# Patient Record
Sex: Male | Born: 1975 | Race: White | Hispanic: Yes | Marital: Single | Smoking: Former smoker
Health system: Southern US, Community
[De-identification: ages and names within clinical notes are randomized; demographics above are authoritative.]

## PROBLEM LIST (undated history)

## (undated) HISTORY — PX: KNEE SURGERY: SHX244

---

## 2004-08-12 ENCOUNTER — Emergency Department: Payer: Self-pay | Admitting: Emergency Medicine

## 2004-08-17 ENCOUNTER — Emergency Department: Payer: Self-pay | Admitting: Emergency Medicine

## 2007-10-23 ENCOUNTER — Emergency Department (HOSPITAL_COMMUNITY): Admission: EM | Admit: 2007-10-23 | Discharge: 2007-10-23 | Payer: Self-pay | Admitting: Family Medicine

## 2016-08-19 ENCOUNTER — Encounter: Payer: Self-pay | Admitting: Emergency Medicine

## 2016-08-19 ENCOUNTER — Emergency Department
Admission: EM | Admit: 2016-08-19 | Discharge: 2016-08-19 | Disposition: A | Discharge status: Home / Self Care | Payer: Self-pay | Attending: Emergency Medicine | Admitting: Emergency Medicine

## 2016-08-19 ENCOUNTER — Emergency Department: Payer: Self-pay

## 2016-08-19 DIAGNOSIS — J189 Pneumonia, unspecified organism: Secondary | ICD-10-CM | POA: Insufficient documentation

## 2016-08-19 MED ORDER — IBUPROFEN 600 MG PO TABS
ORAL_TABLET | ORAL | Status: AC
  Administered 2016-08-19: 600 mg via ORAL
  Filled 2016-08-19: qty 1

## 2016-08-19 MED ORDER — DOXYCYCLINE HYCLATE 100 MG PO CAPS: 100.0000 mg | ORAL_CAPSULE | Freq: Two times a day (BID) | ORAL | 0 refills | Status: DC

## 2016-08-19 MED ORDER — IBUPROFEN 600 MG PO TABS
600.0000 mg | ORAL_TABLET | Freq: Once | ORAL | Status: AC
  Administered 2016-08-19: 600 mg via ORAL

## 2016-08-19 MED ORDER — IPRATROPIUM-ALBUTEROL 0.5-2.5 (3) MG/3ML IN SOLN
3.0000 mL | Freq: Once | RESPIRATORY_TRACT | Status: AC
  Administered 2016-08-19: 3 mL via RESPIRATORY_TRACT
  Filled 2016-08-19: qty 3

## 2016-08-19 NOTE — Discharge Instructions (Signed)
Please seek medical attention for any high fevers, chest pain, shortness of breath, change in behavior, persistent vomiting, bloody stool or any other new or concerning symptoms.  

## 2016-08-19 NOTE — ED Notes (Signed)
Pt transferred to Xray by [employer]

## 2016-08-19 NOTE — ED Provider Notes (Signed)
[facility] Emergency Department Provider Note  ____________________________________________   I have reviewed the triage vital signs and the nursing notes.   HISTORY  Chief Complaint Nasal Congestion   History limited by: Language [facility] Interpreter utilized   HPI Darrell Meyer is a 41 y.o. male who presents to the emergency department today because of concern for fever, cough, nasal congestion and headache. The symptoms have been present for the past 10-11 days. He does not have a thermometer so is unsure how high his fever has gotten. His fever has not been present every day. He had been using ibuprofen for the temperature and allergy medication for nasal congestion. Denies sick contacts.    History reviewed. No pertinent past medical history.  There are no active problems to display for this patient.   History reviewed. No pertinent surgical history.  Prior to Admission medications   Not on File    Allergies Patient has no known allergies.  History reviewed. No pertinent family history.  Social History Social History  Substance Use Topics  . Smoking status: Never Smoker  . Smokeless tobacco: Never Used  . Alcohol use Yes    Review of Systems  Constitutional: Positive for fever. Cardiovascular: Positive for chest pain. Respiratory: Positive for cough. Gastrointestinal: Negative for abdominal pain, vomiting and diarrhea. Genitourinary: Negative for dysuria. Musculoskeletal: Negative for back pain. Skin: Negative for rash. Neurological: Positive for headache.  10-point ROS otherwise negative.  ____________________________________________   PHYSICAL EXAM:  VITAL SIGNS: ED Triage Vitals [08/19/16 0508]  Enc Vitals Group     BP (!) 149/89     Pulse Rate 88     Resp 18     Temp 98.5 F (36.9 C)     Temp Source Oral     SpO2 100 %     Weight 207 lb (93.9 kg)     Height 6' (1.829 m)     Head Circumference    Peak Flow      Pain Score 9   Constitutional: Alert and oriented. Well appearing and in no distress. Eyes: Conjunctivae are normal. Normal extraocular movements. ENT   Head: Normocephalic and atraumatic.   Nose: No congestion/rhinnorhea.   Mouth/Throat: Mucous membranes are moist.   Neck: No stridor. Hematological/Lymphatic/Immunilogical: No cervical lymphadenopathy. Cardiovascular: Normal rate, regular rhythm.  No murmurs, rubs, or gallops.  Respiratory: Normal respiratory effort without tachypnea nor retractions. Rhonchi in right lower lung. Gastrointestinal: Soft and non tender. No rebound. No guarding.  Genitourinary: Deferred Musculoskeletal: Normal range of motion in all extremities. No lower extremity edema. Neurologic:  Normal speech and language. No gross focal neurologic deficits are appreciated.  Skin:  Skin is warm, dry and intact. No rash noted. Psychiatric: Mood and affect are normal. Speech and behavior are normal. Patient exhibits appropriate insight and judgment.  ____________________________________________    LABS (pertinent positives/negatives)  None  ____________________________________________   EKG  None  ____________________________________________    RADIOLOGY  CXR IMPRESSION:  1. Subtle streaky opacities at the left lung base, most likely  atelectasis, less likely pneumonia unless febrile.  2. Remainder of the chest x-ray is unremarkable.    ___________________________________________   PROCEDURES  Procedures  ____________________________________________   INITIAL IMPRESSION / ASSESSMENT AND PLAN / ED COURSE  Pertinent labs & imaging results that were available during my care of the patient were reviewed by me and considered in my medical decision making (see chart for details).  Patient presented to the emergency today with  multiple complaints. Chest x-ray is concerning for possible pneumonia. Patient states that he  has been having fevers and cough. Given this finding do think patient would benefit from a course of antibiotics. Additionally was complaining of some headache and sinus pain. Will plan on placing patient on doxycycline for pneumonia and possible sinusitis.  ____________________________________________   FINAL CLINICAL IMPRESSION(S) / ED DIAGNOSES  Final diagnoses:  Community acquired pneumonia, unspecified laterality     Note: This dictation was prepared with Office manager. Any transcriptional errors that result from this process are unintentional     Phineas Semen, MD 08/19/16 [phone]

## 2016-08-19 NOTE — ED Triage Notes (Addendum)
Pt ambulatory to triage in NAD, report nasal congestion and body aches x 12 days.  Pt reports taking claritin w/o relief.  Pt afebrile in triage.

## 2017-03-13 ENCOUNTER — Encounter: Payer: Self-pay | Admitting: Emergency Medicine

## 2017-03-13 ENCOUNTER — Other Ambulatory Visit: Payer: Self-pay

## 2017-03-13 DIAGNOSIS — R112 Nausea with vomiting, unspecified: Secondary | ICD-10-CM | POA: Insufficient documentation

## 2017-03-13 DIAGNOSIS — R197 Diarrhea, unspecified: Secondary | ICD-10-CM | POA: Insufficient documentation

## 2017-03-13 DIAGNOSIS — R1011 Right upper quadrant pain: Secondary | ICD-10-CM | POA: Insufficient documentation

## 2017-03-13 LAB — COMPREHENSIVE METABOLIC PANEL
ALBUMIN: 4.4 g/dL (ref 3.5–5.0)
ALK PHOS: 78 U/L (ref 38–126)
ALT: 24 U/L (ref 17–63)
ANION GAP: 10 (ref 5–15)
AST: 18 U/L (ref 15–41)
BUN: 17 mg/dL (ref 6–20)
CALCIUM: 9.1 mg/dL (ref 8.9–10.3)
CHLORIDE: 104 mmol/L (ref 101–111)
CO2: 23 mmol/L (ref 22–32)
CREATININE: 1.3 mg/dL — AB (ref 0.61–1.24)
GFR calc non Af Amer: >60 mL/min (ref >60)
GLUCOSE: 116 mg/dL — AB (ref 65–99)
Potassium: 4.2 mmol/L (ref 3.5–5.1)
SODIUM: 137 mmol/L (ref 135–145)
Total Bilirubin: 0.3 mg/dL (ref 0.3–1.2)
Total Protein: 7.6 g/dL (ref 6.5–8.1)

## 2017-03-13 LAB — CBC
HCT: 49.2 % (ref 40.0–52.0)
HEMOGLOBIN: 16.6 g/dL (ref 13.0–18.0)
MCH: 31.1 pg (ref 26.0–34.0)
MCHC: 33.7 g/dL (ref 32.0–36.0)
MCV: 92.2 fL (ref 80.0–100.0)
Platelets: 166 10*3/uL (ref 150–440)
RBC: 5.33 MIL/uL (ref 4.40–5.90)
RDW: 13.9 % (ref 11.5–14.5)
WBC: 8.6 10*3/uL (ref 3.8–10.6)

## 2017-03-13 LAB — URINALYSIS, COMPLETE (UACMP) WITH MICROSCOPIC
Bilirubin Urine: NEGATIVE
Glucose, UA: NEGATIVE mg/dL
Hgb urine dipstick: NEGATIVE
Ketones, ur: NEGATIVE mg/dL
Leukocytes, UA: NEGATIVE
Nitrite: NEGATIVE
Protein, ur: NEGATIVE mg/dL
SPECIFIC GRAVITY, URINE: 1.02 (ref 1.005–1.030)
pH: 5 (ref 5.0–8.0)

## 2017-03-13 LAB — TROPONIN I

## 2017-03-13 LAB — LIPASE, BLOOD: LIPASE: 30 U/L (ref 11–51)

## 2017-03-13 NOTE — ED Triage Notes (Signed)
Pt presents to ED with diarrhea and vomiting after eating for the past 3 days. Pt reports intermittently feeling full in his abd with pressure and bloating, especially after eating. Pt alert and calm during triage. No increased work of breathing or distress noted at this time.

## 2017-03-13 NOTE — ED Triage Notes (Signed)
Patient to ER for c/o generalized abd pain x3 days that began with diarrhea.

## 2017-03-14 ENCOUNTER — Emergency Department
Admission: EM | Admit: 2017-03-14 | Discharge: 2017-03-14 | Disposition: A | Discharge status: Home / Self Care | Payer: Self-pay | Attending: Emergency Medicine | Admitting: Emergency Medicine

## 2017-03-14 ENCOUNTER — Emergency Department: Payer: Self-pay

## 2017-03-14 DIAGNOSIS — R1011 Right upper quadrant pain: Secondary | ICD-10-CM

## 2017-03-14 DIAGNOSIS — R112 Nausea with vomiting, unspecified: Secondary | ICD-10-CM

## 2017-03-14 DIAGNOSIS — A09 Infectious gastroenteritis and colitis, unspecified: Secondary | ICD-10-CM

## 2017-03-14 MED ORDER — KETOROLAC TROMETHAMINE 30 MG/ML IJ SOLN
30.0000 mg | Freq: Once | INTRAMUSCULAR | Status: AC
  Administered 2017-03-14: 30 mg via INTRAVENOUS
  Filled 2017-03-14: qty 1

## 2017-03-14 MED ORDER — ONDANSETRON 4 MG PO TBDP: 4.0000 mg | ORAL_TABLET | Freq: Three times a day (TID) | ORAL | 0 refills | Status: DC | PRN

## 2017-03-14 MED ORDER — ONDANSETRON HCL 4 MG/2ML IJ SOLN
4.0000 mg | Freq: Once | INTRAMUSCULAR | Status: AC
  Administered 2017-03-14: 4 mg via INTRAVENOUS
  Filled 2017-03-14: qty 2

## 2017-03-14 NOTE — ED Provider Notes (Signed)
[facility] Emergency Department Provider Note   First MD Initiated Contact with Patient 03/14/17 0101     (approximate)  I have reviewed the triage vital signs and the nursing notes.   HISTORY  Chief Complaint Diarrhea and Emesis    HPI Darrell Meyer is a 41 y.o. male presents to the emergency department with nonbloody diarrhea and vomiting after eating 3 days ago.  Patient states that he has intermittent fullness to the upper abdomen following eating which she describes as bloating.  Patient states last vomiting episode was yesterday patient states last diarrheal episode was today and again was nonbloody.  Patient states his current pain score is 7 out of 10 and located in the upper abdomen.  Patient denies any fever.  Patient denies any urinary symptoms.   Past medical history None There are no active problems to display for this patient.   Past Surgical History:  Procedure Laterality Date  . KNEE SURGERY      Prior to Admission medications   Medication Sig Start Date End Date Taking? Authorizing Provider  doxycycline (VIBRAMYCIN) 100 MG capsule Take 1 capsule (100 mg total) by mouth 2 (two) times daily. 08/19/16   Phineas SemenGoodman, [place], MD  ondansetron (ZOFRAN ODT) 4 MG disintegrating tablet Take 1 tablet (4 mg total) every 8 (eight) hours as needed by mouth for nausea or vomiting. 03/14/17   Darci CurrentBrown, [place] N, MD    Allergies No known drug allergies  No family history on file.  Social History Social History   Tobacco Use  . Smoking status: Never Smoker  . Smokeless tobacco: Never Used  Substance Use Topics  . Alcohol use: Yes  . Drug use: No    Review of Systems Constitutional: No fever/chills Eyes: No visual changes. ENT: No sore throat. Cardiovascular: Denies chest pain. Respiratory: Denies shortness of breath. Gastrointestinal: Positive for abdominal pain vomiting and diarrhea.   Genitourinary: Negative for  dysuria. Musculoskeletal: Negative for neck pain.  Negative for back pain. Integumentary: Negative for rash. Neurological: Negative for headaches, focal weakness or numbness.   ____________________________________________   PHYSICAL EXAM:  VITAL SIGNS: ED Triage Vitals  Enc Vitals Group     BP 03/13/17 2151 (!) 165/94     Pulse Rate 03/13/17 2151 75     Resp 03/13/17 2151 18     Temp 03/13/17 2151 98.5 F (36.9 C)     Temp Source 03/13/17 2151 Oral     SpO2 03/13/17 2151 97 %     Weight 03/13/17 2152 93.9 kg (207 lb)     Height 03/13/17 2152 1.829 m (6')     Head Circumference --      Peak Flow --      Pain Score 03/13/17 2151 8     Pain Loc --      Pain Edu? --      Excl. in GC? --     Constitutional: Alert and oriented. Well appearing and in no acute distress. Eyes: Conjunctivae are normal.  Head: Atraumatic. Mouth/Throat: Mucous membranes are moist. Oropharynx non-erythematous. Neck: No stridor.   Cardiovascular: Normal rate, regular rhythm. Good peripheral circulation. Grossly normal heart sounds. Respiratory: Normal respiratory effort.  No retractions. Lungs CTAB. Gastrointestinal: Right upper quadrant/epigastric mild tenderness to palpation.  No distention.  Musculoskeletal: No lower extremity tenderness nor edema. No gross deformities of extremities. Neurologic:  Normal speech and language. No gross focal neurologic deficits are appreciated.  Skin:  Skin is warm, dry and intact.  No rash noted. Psychiatric: Mood and affect are normal. Speech and behavior are normal.  ____________________________________________   LABS (all labs ordered are listed, but only abnormal results are displayed)  Labs Reviewed  COMPREHENSIVE METABOLIC PANEL - Abnormal; Notable for the following components:      Result Value   Glucose, Bld 116 (*)    Creatinine, Ser 1.30 (*)    All other components within normal limits  URINALYSIS, COMPLETE (UACMP) WITH MICROSCOPIC - Abnormal;  Notable for the following components:   Color, Urine YELLOW (*)    APPearance CLEAR (*)    Bacteria, UA RARE (*)    Squamous Epithelial / LPF 0-5 (*)    All other components within normal limits  LIPASE, BLOOD  CBC  TROPONIN I   ____________________________________________  EKG  Time: 10:07 PM Rate: 68 Rhythm: Normal sinus rhythm Axis: Normal Interval: Normal ST segment: No ST segment changes ____________________________________________  RADIOLOGY I, [place] N Aaren Atallah, personally viewed and evaluated these images (plain radiographs) as part of my medical decision making, as well as reviewing the written report by the radiologist.  [place] Abdomen Limited Ruq  Result Date: 03/14/2017 CLINICAL DATA:  Initial evaluation for acute right upper quadrant and generalized abdominal pain. EXAM: ULTRASOUND ABDOMEN LIMITED RIGHT UPPER QUADRANT COMPARISON:  None. FINDINGS: Gallbladder: No gallstones or wall thickening visualized. No sonographic Murphy sign noted by sonographer. Common bile duct: Diameter: 3.4 mm Liver: No focal lesion identified. Diffusely increased echogenicity, suggesting steatosis. Focal fatty sparing noted adjacent to the gallbladder fossa. Portal vein is patent on color Doppler imaging with normal direction of blood flow towards the liver. IMPRESSION: 1. Normal sonographic appearance of the gallbladder. No biliary dilatation. 2. Hepatic steatosis. Electronically Signed   By: Rise MuBenjamin  McClintock M.D.   On: 03/14/2017 02:52    ______________ Procedures   ____________________________________________   INITIAL IMPRESSION / ASSESSMENT AND PLAN / ED COURSE  As part of my medical decision making, I reviewed the following data within the electronic MEDICAL RECORD [id number] year old male presenting with above-stated history of abdominal discomfort vomiting and diarrhea suspect gastroenteritis versus gallbladder disease as etiology for patient's discomfort and as such ultrasound was  performed which is negative.  In addition the patient laboratory data unremarkable.  Patient had no vomiting or diarrhea while in the emergency department.  Patient given Zofran and advice regarding oral hydration for home.    ____________________________________________  FINAL CLINICAL IMPRESSION(S) / ED DIAGNOSES  Final diagnoses:  RUQ pain  Diarrhea of infectious origin  Non-intractable vomiting with nausea, unspecified vomiting type     MEDICATIONS GIVEN DURING THIS VISIT:  Medications  ketorolac (TORADOL) 30 MG/ML injection 30 mg (30 mg Intravenous Given 03/14/17 0141)  ondansetron (ZOFRAN) injection 4 mg (4 mg Intravenous Given 03/14/17 0141)     ED Discharge Orders        Ordered    ondansetron (ZOFRAN ODT) 4 MG disintegrating tablet  Every 8 hours PRN     03/14/17 0357       Note:  This document was prepared using Dragon voice recognition software and may include unintentional dictation errors.    Darci CurrentBrown, [place] N, MD 03/14/17 2258

## 2017-03-14 NOTE — ED Notes (Signed)
Patient transported to Ultrasound 

## 2020-03-03 ENCOUNTER — Other Ambulatory Visit: Payer: Self-pay

## 2020-03-03 ENCOUNTER — Emergency Department
Admission: EM | Admit: 2020-03-03 | Discharge: 2020-03-03 | Disposition: A | Discharge status: Home / Self Care | Payer: Self-pay | Attending: Emergency Medicine | Admitting: Emergency Medicine

## 2020-03-03 ENCOUNTER — Emergency Department: Payer: Self-pay

## 2020-03-03 DIAGNOSIS — N50812 Left testicular pain: Secondary | ICD-10-CM

## 2020-03-03 DIAGNOSIS — N410 Acute prostatitis: Secondary | ICD-10-CM | POA: Insufficient documentation

## 2020-03-03 DIAGNOSIS — N419 Inflammatory disease of prostate, unspecified: Secondary | ICD-10-CM

## 2020-03-03 LAB — BASIC METABOLIC PANEL
Anion gap: 10 (ref 5–15)
BUN: 13 mg/dL (ref 6–20)
CO2: 24 mmol/L (ref 22–32)
Calcium: 9.1 mg/dL (ref 8.9–10.3)
Chloride: 103 mmol/L (ref 98–111)
Creatinine, Ser: 1.26 mg/dL — ABNORMAL HIGH (ref 0.61–1.24)
GFR, Estimated: >60 mL/min (ref >60)
Glucose, Bld: 98 mg/dL (ref 70–99)
Potassium: 4 mmol/L (ref 3.5–5.1)
Sodium: 137 mmol/L (ref 135–145)

## 2020-03-03 LAB — CBC
HCT: 45.1 % (ref 39.0–52.0)
Hemoglobin: 15.2 g/dL (ref 13.0–17.0)
MCH: 32 pg (ref 26.0–34.0)
MCHC: 33.7 g/dL (ref 30.0–36.0)
MCV: 94.9 fL (ref 80.0–100.0)
Platelets: 177 10*3/uL (ref 150–400)
RBC: 4.75 MIL/uL (ref 4.22–5.81)
RDW: 12.9 % (ref 11.5–15.5)
WBC: 7.8 10*3/uL (ref 4.0–10.5)
nRBC: 0 % (ref 0.0–0.2)

## 2020-03-03 LAB — URINALYSIS, COMPLETE (UACMP) WITH MICROSCOPIC
Bacteria, UA: NONE SEEN
Bilirubin Urine: NEGATIVE
Glucose, UA: NEGATIVE mg/dL
Hgb urine dipstick: NEGATIVE
Ketones, ur: NEGATIVE mg/dL
Leukocytes,Ua: NEGATIVE
Nitrite: NEGATIVE
Protein, ur: NEGATIVE mg/dL
Specific Gravity, Urine: 1.003 — ABNORMAL LOW (ref 1.005–1.030)
Squamous Epithelial / LPF: NONE SEEN (ref 0–5)
pH: 6 (ref 5.0–8.0)

## 2020-03-03 LAB — CHLAMYDIA/NGC RT PCR (ARMC ONLY)
Chlamydia Tr: NOT DETECTED
N gonorrhoeae: NOT DETECTED

## 2020-03-03 MED ORDER — TAMSULOSIN HCL 0.4 MG PO CAPS: 0.4000 mg | ORAL_CAPSULE | Freq: Every day | ORAL | 0 refills | Status: DC

## 2020-03-03 MED ORDER — OXYCODONE-ACETAMINOPHEN 5-325 MG PO TABS
1.0000 | ORAL_TABLET | Freq: Once | ORAL | Status: AC
  Administered 2020-03-03: 1 via ORAL
  Filled 2020-03-03: qty 1

## 2020-03-03 MED ORDER — SULFAMETHOXAZOLE-TRIMETHOPRIM 800-160 MG PO TABS: 1.0000 | ORAL_TABLET | Freq: Two times a day (BID) | ORAL | 0 refills | Status: AC

## 2020-03-03 NOTE — ED Notes (Signed)
Pt presents to ED w/ c/o of intermittent lower ABD pain, penile, and testicle pain that has been ongoing for about 6 months but pt states its comes and goes but states it has stayed longer in duration and the severity of pain is worse than prior epsiodes. Pt states burning on urination. Pt has c/o of N/V and fevers. Pt also states pain while having BM. Pt states he is sexually active but is married and "does not fool around". Pt denies HX of kidney stones. NAD. Pt is A&Ox4, medical interperter at bedside with MD and this RN.

## 2020-03-03 NOTE — ED Provider Notes (Signed)
[facility] Emergency Department Provider Note   ____________________________________________   First MD Initiated Contact with Patient 03/03/20 1321     (approximate)  I have reviewed the triage vital signs and the nursing notes.   HISTORY  Chief Complaint Abdominal Pain and Groin Pain    HPI Darrell Meyer is a 44 y.o. male with no significant past medical history who presents to the ED complaining of dysuria and groin pain.  Patient reports that he has been having intermittent episodes of dysuria, lower abdominal discomfort, and rectal discomfort for the past 6 months.  He states it seems to burn when he urinates but he has not had any penile discharge or hematuria.  He has not noticed any swelling or rashes in his groin, but he does report pain in both of his testicles.  He additionally states it is painful when he goes to have a bowel movement and he feels like he does not always fully empty his bladder.  He denies any fevers, nausea, vomiting, abdominal pain, cough, chest pain, shortness of breath.        History reviewed. No pertinent past medical history.  There are no problems to display for this patient.   Past Surgical History:  Procedure Laterality Date  . KNEE SURGERY      Prior to Admission medications   Medication Sig Start Date End Date Taking? Authorizing Provider  doxycycline (VIBRAMYCIN) 100 MG capsule Take 1 capsule (100 mg total) by mouth 2 (two) times daily. 08/19/16   Phineas Semen, MD  ondansetron (ZOFRAN ODT) 4 MG disintegrating tablet Take 1 tablet (4 mg total) every 8 (eight) hours as needed by mouth for nausea or vomiting. 03/14/17   Darci Current, MD  sulfamethoxazole-trimethoprim (BACTRIM DS) 800-160 MG tablet Take 1 tablet by mouth 2 (two) times daily for 28 days. 03/03/20 03/31/20  Chesley Noon, MD  tamsulosin (FLOMAX) 0.4 MG CAPS capsule Take 1 capsule (0.4 mg total) by mouth daily after supper. 03/03/20    Chesley Noon, MD    Allergies Patient has no known allergies.  No family history on file.  Social History Social History   Tobacco Use  . Smoking status: Never Smoker  . Smokeless tobacco: Never Used  Vaping Use  . Vaping Use: Never used  Substance Use Topics  . Alcohol use: Yes  . Drug use: No    Review of Systems  Constitutional: No fever/chills Eyes: No visual changes. ENT: No sore throat. Cardiovascular: Denies chest pain. Respiratory: Denies shortness of breath. Gastrointestinal: No abdominal pain.  No nausea, no vomiting.  No diarrhea.  No constipation.  Positive for rectal pain. Genitourinary: Positive for dysuria and testicular pain. Musculoskeletal: Negative for back pain. Skin: Negative for rash. Neurological: Negative for headaches, focal weakness or numbness.  ____________________________________________   PHYSICAL EXAM:  VITAL SIGNS: ED Triage Vitals  Enc Vitals Group     BP 03/03/20 1213 132/85     Pulse Rate 03/03/20 1213 68     Resp 03/03/20 1213 18     Temp 03/03/20 1213 99 F (37.2 C)     Temp Source 03/03/20 1213 Oral     SpO2 03/03/20 1213 100 %     Weight 03/03/20 1216 217 lb (98.4 kg)     Height 03/03/20 1216 6' (1.829 m)     Head Circumference --      Peak Flow --      Pain Score 03/03/20 1216 9  Pain Loc --      Pain Edu? --      Excl. in GC? --     Constitutional: Alert and oriented. Eyes: Conjunctivae are normal. Head: Atraumatic. Nose: No congestion/rhinnorhea. Mouth/Throat: Mucous membranes are moist. Neck: Normal ROM Cardiovascular: Normal rate, regular rhythm. Grossly normal heart sounds. Respiratory: Normal respiratory effort.  No retractions. Lungs CTAB. Gastrointestinal: Soft and nontender. No distention.  On rectal exam patient with enlarged prostate that is tender to palpation. Genitourinary: Bilateral testicular tenderness with no masses, erythema, or warmth.  No discharge noted. Musculoskeletal: No lower  extremity tenderness nor edema. Neurologic:  Normal speech and language. No gross focal neurologic deficits are appreciated. Skin:  Skin is warm, dry and intact. No rash noted. Psychiatric: Mood and affect are normal. Speech and behavior are normal.  ____________________________________________   LABS (all labs ordered are listed, but only abnormal results are displayed)  Labs Reviewed  URINALYSIS, COMPLETE (UACMP) WITH MICROSCOPIC - Abnormal; Notable for the following components:      Result Value   Color, Urine YELLOW (*)    APPearance CLEAR (*)    Specific Gravity, Urine 1.003 (*)    All other components within normal limits  BASIC METABOLIC PANEL - Abnormal; Notable for the following components:   Creatinine, Ser 1.26 (*)    All other components within normal limits  URINE CULTURE  CHLAMYDIA/NGC RT PCR ([facility] ONLY)  CBC    PROCEDURES  Procedure(s) performed (including Critical Care):  Procedures   ____________________________________________   INITIAL IMPRESSION / ASSESSMENT AND PLAN / ED COURSE       44 year old male with no significant past medical history presents to the ED complaining of intermittent episodes for the past 6 months of dysuria, testicular pain, and pain with bowel movements.  He has a swollen and tender prostate on exam and I am concerned his symptoms are related to prostatitis.  There is no clear evidence of UTI on his UA, we will send for culture and also perform testing for STI.  He states he is only sexually active with his wife and is not concerned for sexually transmitted infection.  He has no abdominal tenderness on exam but given his testicular tenderness, we will further assess with ultrasound and treat his pain with Percocet.  Testicular ultrasound is unremarkable, no evidence of epididymitis or abscess.  GC/chlamydia testing is pending, but I have a low suspicion for sexually transmitted infection at this time.  We will treat patient  for prostatitis with 28-day course of Bactrim and he was provided with referral to urology.  He was counseled to return to the ED for new or worsening symptoms, patient agrees with plan.      ____________________________________________   FINAL CLINICAL IMPRESSION(S) / ED DIAGNOSES  Final diagnoses:  Prostatitis, unspecified prostatitis type     ED Discharge Orders         Ordered    sulfamethoxazole-trimethoprim (BACTRIM DS) 800-160 MG tablet  2 times daily        03/03/20 1507    tamsulosin (FLOMAX) 0.4 MG CAPS capsule  Daily after supper        03/03/20 1507           Note:  This document was prepared using Dragon voice recognition software and may include unintentional dictation errors.   Chesley Noon, MD 03/03/20 [phone]

## 2020-03-03 NOTE — ED Triage Notes (Signed)
Pt to ED c/o lower abdominal pain below the belly button and burning with urination x 1 week with fever vomiting and chills  Reports left sided flank pain Yellow/orange urine  Reports taking azo Denies kidney stone hx

## 2020-03-03 NOTE — ED Notes (Signed)
D/C and new RX discussed with pt. No distress noted. Pt verbalized understanding. VSS.

## 2020-03-04 LAB — URINE CULTURE: Culture: NO GROWTH

## 2020-03-10 ENCOUNTER — Encounter: Payer: Self-pay | Admitting: Urology

## 2020-03-10 ENCOUNTER — Other Ambulatory Visit: Payer: Self-pay

## 2020-03-10 ENCOUNTER — Ambulatory Visit (INDEPENDENT_AMBULATORY_CARE_PROVIDER_SITE_OTHER): Payer: Self-pay | Admitting: Urology

## 2020-03-10 VITALS — BP 121/67 | HR 65

## 2020-03-10 DIAGNOSIS — N419 Inflammatory disease of prostate, unspecified: Secondary | ICD-10-CM

## 2020-03-10 LAB — BLADDER SCAN AMB NON-IMAGING

## 2020-03-10 MED ORDER — CELECOXIB 100 MG PO CAPS: 100.0000 mg | ORAL_CAPSULE | Freq: Every morning | ORAL | 0 refills | Status: DC

## 2020-03-10 NOTE — Patient Instructions (Signed)
Prostatitis °Prostatitis ° °La prostatitis es la hinchazón de la próstata. La próstata ayuda a producir el semen. Se encuentra debajo de la vejiga del hombre, frente al recto. Hay distintos tipos de prostatitis. °Siga estas indicaciones en su casa: ° °· Tome los medicamentos de venta libre y los recetados solamente como se lo haya indicado el médico. °· Si le recetaron un antibiótico, tómelo como se lo haya indicado el médico. No deje de tomar los antibióticos aunque comience a sentirse mejor. °· Si el médico le prescribió ejercicios, hágalos según las indicaciones. °· Tome baños de asiento como se lo haya indicado el médico. Para tomar un baño de asiento, siéntese en agua tibia que le cubra las caderas y las nalgas. °· Concurra a todas las visitas de control como se lo haya indicado el médico. Esto es importante. °Comuníquese con un médico si: °· Los síntomas empeoran. °· Tiene fiebre. °Solicite ayuda de inmediato si: °· Tiene escalofríos. °· Siente malestar estomacal (náuseas). °· Vomita. °· Siente que va a desvanecerse. °· Siente que podría desvanecerse (desmayarse). °· No puede hacer pis (orinar). °· Tiene sangre o grumos de sangre (coágulos de sangre) en el pis (orina). °Esta información no tiene como fin reemplazar el consejo del médico. Asegúrese de hacerle al médico cualquier pregunta que tenga. °Document Revised: 12/27/2016 Document Reviewed: 10/21/2015 °Elsevier Patient Education © 2020 [employer] ° °

## 2020-03-10 NOTE — Progress Notes (Signed)
   03/10/20 1:29 PM   [name] [birth year] [id number]  CC: Dysuria, urgency/frequency, pelvic pain  HPI: I saw [name] for the above issues and today.  He is a healthy 44 year old male who was seen in the ER on 03/03/2020 with dysuria, lower pelvic pain, rectal discomfort, and perineal pain as well as a feeling of incomplete emptying.  Work-up was completely benign including STD testing, CBC, and benign urinalysis with negative urine culture.  Imaging with a scrotal ultrasound was performed and was also benign.  He was treated for presumed prostatitis with a 28-day course of Bactrim, as well as Flomax daily.  He has been on the antibiotic for 1 week and notes that his symptoms have improved significantly.  He still some mild persistent lower pelvic pain and dysuria.  PVR is normal today at 15 mm.  IPSS score is 15, with quality of life mixed.  He denies any prior episode of UTI or retention.  He does report 6 months ago he had similar symptoms briefly for a few days that resolved spontaneously.  No prior urologic surgeries.  Denies any significant urinary symptoms prior to this episode.  DRE in the ED last week reportedly demonstrated a tender and boggy prostate.   Family History: Family History  Problem Relation Age of Onset  . Prostate cancer Neg Hx   . Bladder Cancer Neg Hx   . Kidney cancer Neg Hx     Social History:  reports that he has quit smoking. He has never used smokeless tobacco. He reports current alcohol use. He reports that he does not use drugs.  Physical Exam: BP 121/67   Pulse 65    Constitutional:  Alert and oriented, No acute distress. Cardiovascular: No clubbing, cyanosis, or edema. Respiratory: Normal respiratory effort, no increased work of breathing. GI: Abdomen is soft, nontender, nondistended, no abdominal masses GU: Circumcised phallus with patent meatus, no lesions, testicles 20 cc and descended bilaterally without masses, no  tenderness  Laboratory Data: Reviewed, see HPI  Pertinent Imaging: I have personally reviewed the scrotal ultrasound that shows no abnormalities  Assessment & Plan:   He is a healthy 44 year old male with likely prostatitis with benign lab work-up and negative scrotal ultrasound.  He is improving after 1 week of antibiotics.  We discussed the importance of completing the 4-week course of antibiotics and taking Flomax for 1 month.  I also recommended 2 weeks of Celebrex for his persistent pelvic discomfort and dysuria.  We discussed the importance of adequate hydration and timed voiding, as well as return precautions.  I encouraged him to push fluids and avoid bladder irritants the next few weeks.  RTC 3 to 4 months for IPSS, PVR, symptom check If recurrent infections or persistent symptoms, consider cystoscopy  Legrand Rams, MD 03/10/2020  [facility] [address], [address] [place], [place] [address] [phone]

## 2020-03-11 LAB — MICROSCOPIC EXAMINATION
Bacteria, UA: NONE SEEN
RBC: NONE SEEN /hpf (ref 0–2)

## 2020-03-11 LAB — URINALYSIS, COMPLETE
Bilirubin, UA: NEGATIVE
Glucose, UA: NEGATIVE
Ketones, UA: NEGATIVE
Leukocytes,UA: NEGATIVE
Nitrite, UA: NEGATIVE
Protein,UA: NEGATIVE
RBC, UA: NEGATIVE
Specific Gravity, UA: >1.03 — ABNORMAL HIGH (ref 1.005–1.030)
Urobilinogen, Ur: 0.2 mg/dL (ref 0.2–1.0)
pH, UA: 5.5 (ref 5.0–7.5)

## 2020-07-07 ENCOUNTER — Other Ambulatory Visit: Payer: Self-pay

## 2020-07-07 ENCOUNTER — Encounter: Payer: Self-pay | Admitting: Urology

## 2020-07-07 ENCOUNTER — Ambulatory Visit (INDEPENDENT_AMBULATORY_CARE_PROVIDER_SITE_OTHER): Payer: Self-pay | Admitting: Urology

## 2020-07-07 DIAGNOSIS — N419 Inflammatory disease of prostate, unspecified: Secondary | ICD-10-CM

## 2020-07-07 LAB — BLADDER SCAN AMB NON-IMAGING: Scan Result: 0

## 2020-07-07 MED ORDER — CELECOXIB 200 MG PO CAPS: 200.0000 mg | ORAL_CAPSULE | Freq: Every day | ORAL | 0 refills | Status: AC

## 2020-07-07 NOTE — Progress Notes (Signed)
   07/07/2020 2:45 PM   Darrell Meyer 07-24-1975 [id number]  Reason for visit: Follow up prostatitis  HPI: I saw [name] back in urology clinic for follow-up of prostatitis.  Briefly, he seen in the ER on 03/03/2020 with dysuria, lower Pelvic Pain, rectal discomfort, and perineal pain, as well as feeling of incomplete emptying.  Work-up was benign, scrotal ultrasound was benign, and he was treated with a 1 month course of Bactrim.  When I saw him in clinic after he had been on antibiotics for 1 week he had noticed significant improvement in his urinary symptoms.  PVR was normal, and IPSS score was 15 at that time.  Today, he continues to improve and overall is doing well.  He reports some mild dysuria about 1 time per week that continues to get better over the last month.  IPSS score today is 2, which is improved significantly from 15 at our last visit.  We discussed options including observation, trial of 2 weeks of Celebrex, or cystoscopy.  He would like to try 2 weeks of Celebrex, and follow-up as needed.  I suspect his symptoms will continue to improve.  Return precautions discussed extensively including gross hematuria, recurrent urinary symptoms, pelvic pain, or UTIs/prostatitis.   Sondra Come, MD  [facility] [address], [address] [place], [place] [address] [phone]

## 2021-06-14 IMAGING — US US SCROTUM W/ DOPPLER COMPLETE
1 series · 15 of 25 positions shown · non-contrast
Comparison: None.

CLINICAL DATA: Bilateral testicular pain over the last 6 months.

EXAM:
SCROTAL ULTRASOUND
DOPPLER ULTRASOUND OF THE TESTICLES
TECHNIQUE: Complete ultrasound examination of the testicles, epididymis, and
other scrotal structures was performed. Color and spectral Doppler
ultrasound were also utilized to evaluate blood flow to the
testicles.

[Series 1: us art/ven flow abd pelv doppl · 15 of 55 slices shown]
[im 1/55]
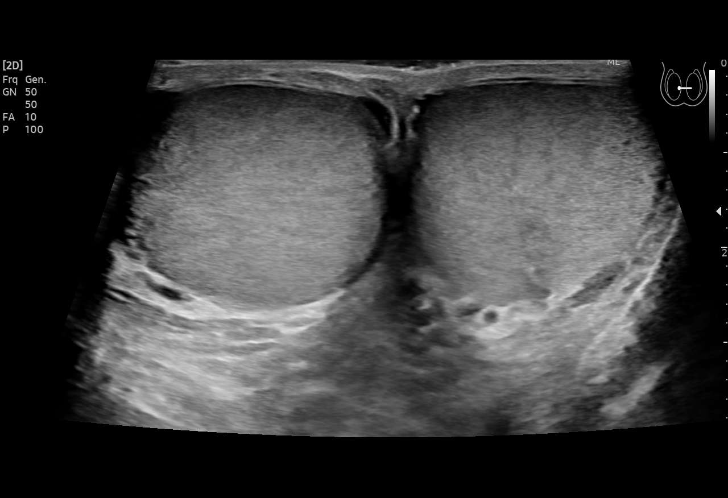
[im 5/55]
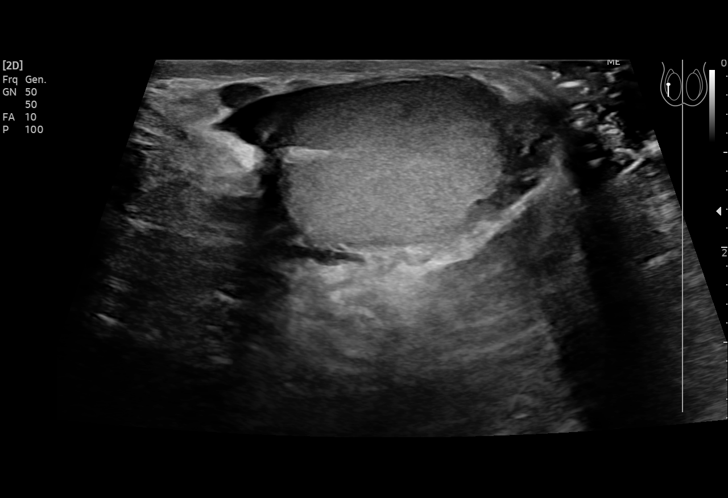
[im 10/55]
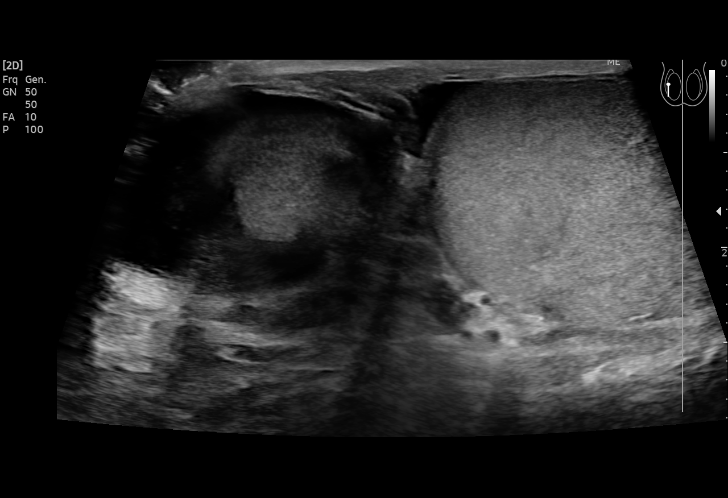
[im 12/55]
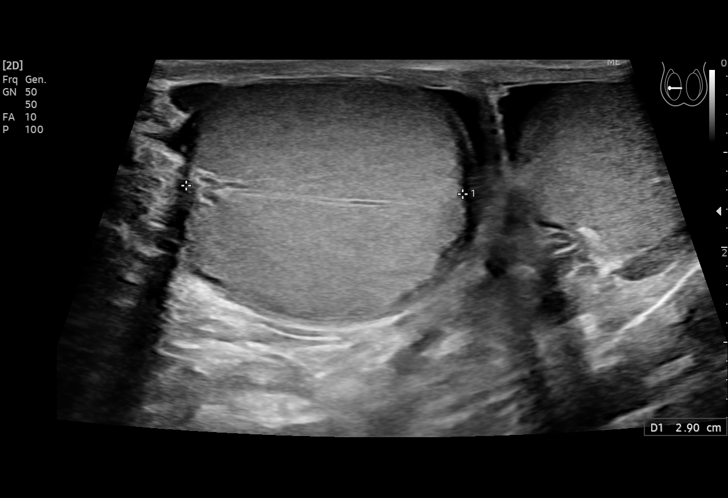
[im 16/55]
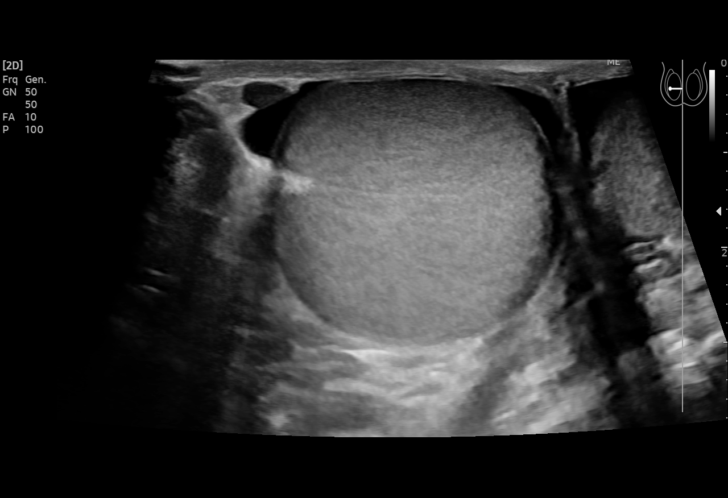
[im 21/55]
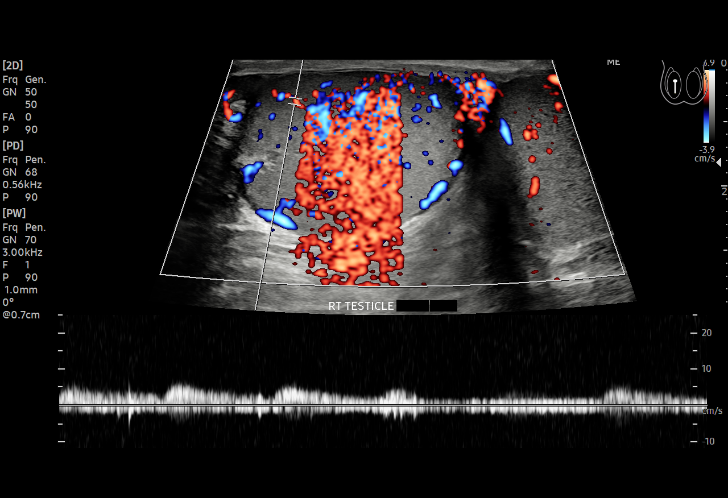
[im 23/55]
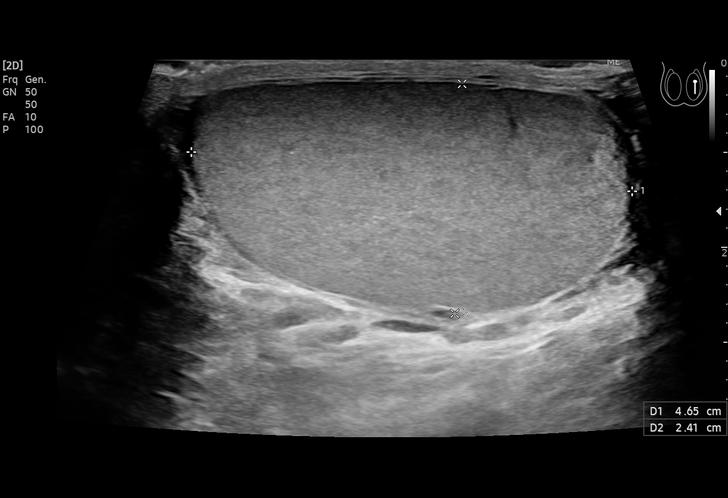
[im 28/55]
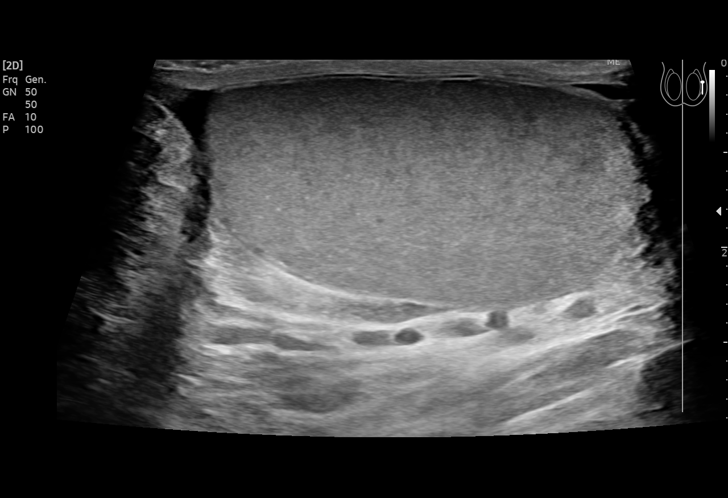
[im 32/55]
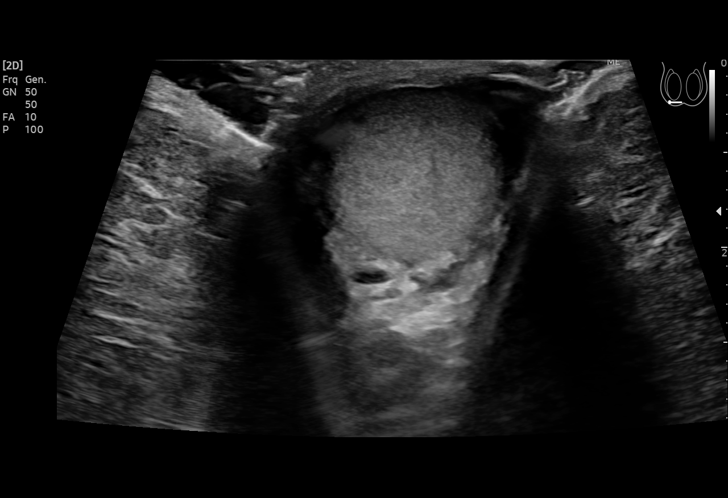
[im 34/55]
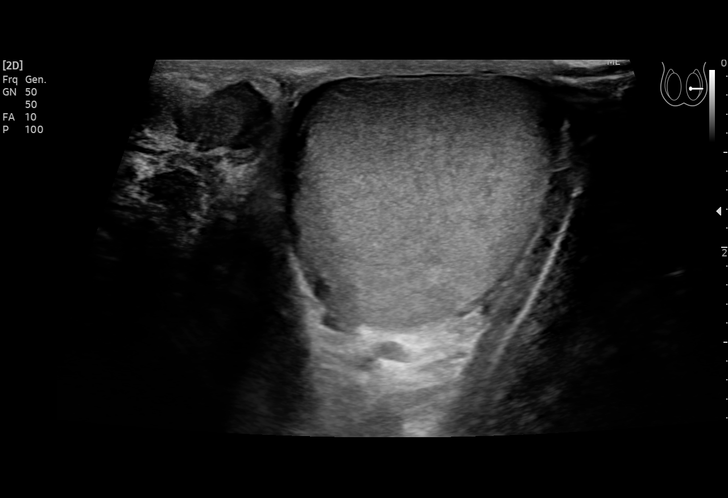
[im 39/55]
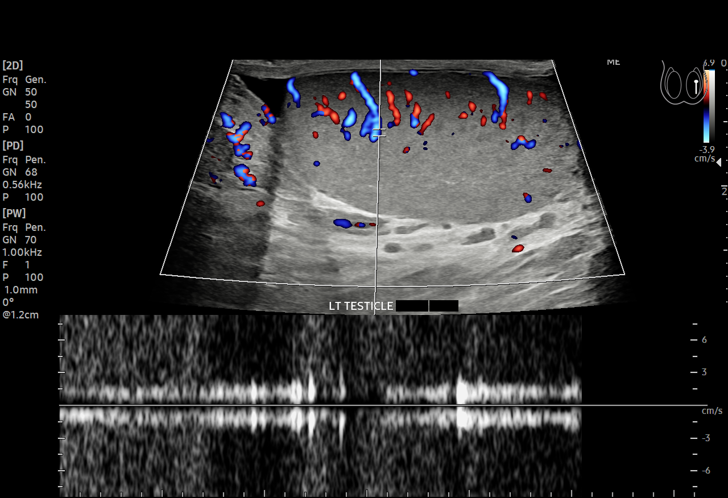
[im 43/55]
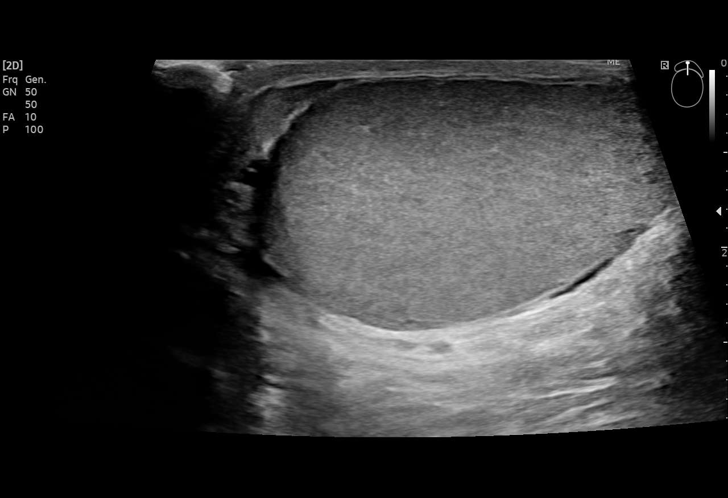
[im 46/55]
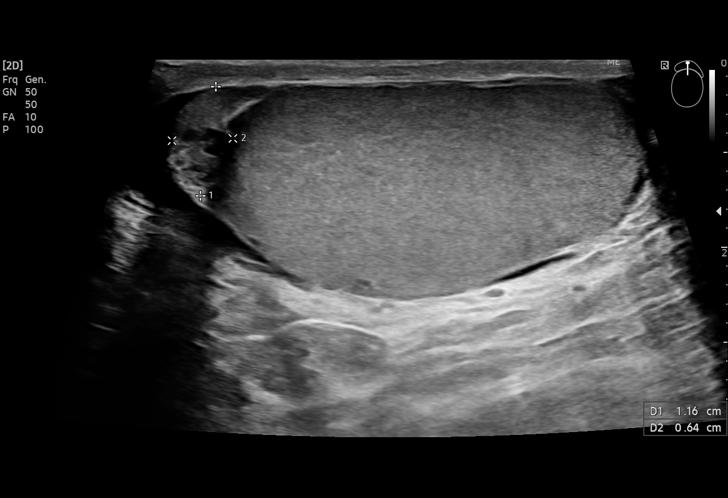
[im 50/55]
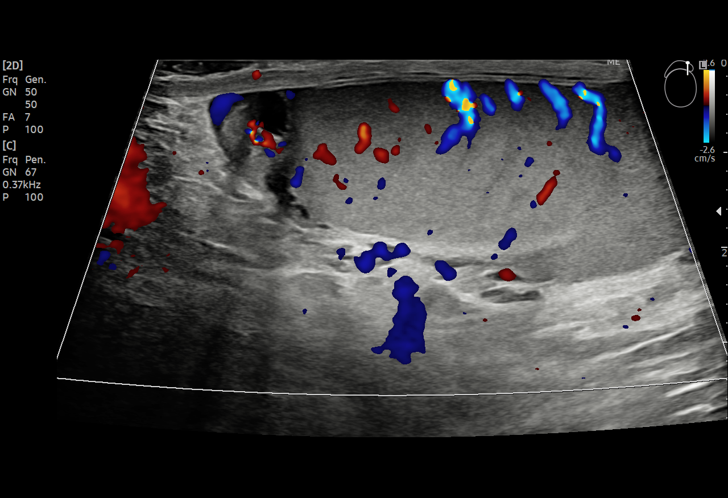
[im 55/55]
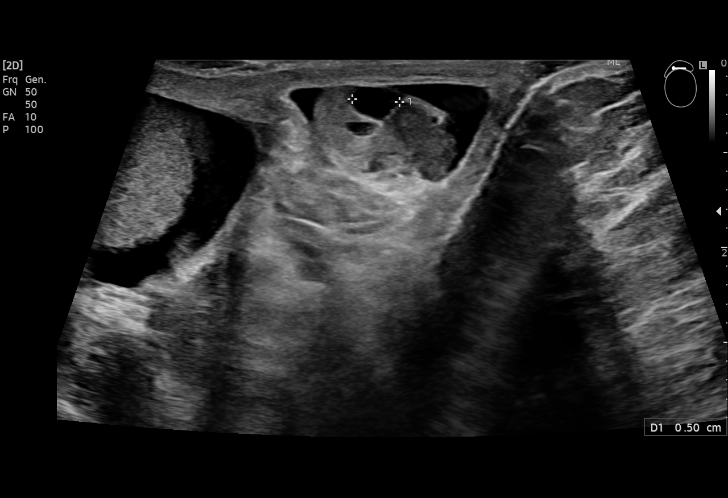

[15 of 25 positions shown; findings below may reference images not displayed]

FINDINGS: Right testicle

Measurements: 4.1 x 2.6 x 2.9 cm. Normal sonographic appearance.
Normal color flow.

Left testicle

Measurements: 4.7 x 2.4 x 2.8 cm. Normal sonographic appearance.
Normal color flow.

Right epididymis:  Normal in size and appearance.

Left epididymis: Normal in size and appearance. Incidental 5 mm
cyst.

Hydrocele:  None visualized.

Varicocele:  None visualized.

Pulsed Doppler interrogation of both testes demonstrates normal low
resistance arterial and venous waveforms bilaterally.
IMPRESSION: Normal scrotal ultrasound. No abnormality seen to explain pain.
Insignificant 5 mm left epididymal cyst.

## 2022-05-08 ENCOUNTER — Other Ambulatory Visit: Payer: Self-pay

## 2022-05-08 ENCOUNTER — Emergency Department
Admission: EM | Admit: 2022-05-08 | Discharge: 2022-05-08 | Disposition: A | Discharge status: Home / Self Care | Payer: Self-pay | Attending: Emergency Medicine | Admitting: Emergency Medicine

## 2022-05-08 DIAGNOSIS — K602 Anal fissure, unspecified: Secondary | ICD-10-CM | POA: Insufficient documentation

## 2022-05-08 DIAGNOSIS — K6289 Other specified diseases of anus and rectum: Secondary | ICD-10-CM | POA: Insufficient documentation

## 2022-05-08 MED ORDER — SITZ BATH MISC: 1.0000 [IU] | Freq: Two times a day (BID) | 0 refills | Status: AC

## 2022-05-08 MED ORDER — LIDOCAINE 5 % EX OINT: 1.0000 | TOPICAL_OINTMENT | CUTANEOUS | 0 refills | Status: AC | PRN

## 2022-05-08 MED ORDER — METAMUCIL SMOOTH TEXTURE 58.6 % PO POWD: 1.0000 | Freq: Every day | ORAL | 12 refills | Status: AC

## 2022-05-08 MED ORDER — POLYETHYLENE GLYCOL 3350 17 G PO PACK: 17.0000 g | PACK | Freq: Every day | ORAL | 0 refills | Status: AC

## 2022-05-08 NOTE — Discharge Instructions (Addendum)
  Gracias por elegirnos para el cuidado de su salud hoy!  Consulte a su mdico de atencin primaria esta semana para una cita de seguimiento.  Si no tiene un mdico de atencin primaria llame a las siguientes clnicas para establecer la atencin:  Si tienes seguro: [facility] [phone] [address] [place] [address]   [facility] [phone] [address] [place] [address]   Si no tienes seguro: [facility] [phone] [address] [place] [address]  A veces, en las primeras etapas del curso de ciertas enfermedades es difcil detectarlo en la evaluacin del departamento de emergencias; por lo tanto, es importante que contine monitoreando sus sntomas y llame a su mdico de inmediato o regrese al departamento de emergencias si desarrolla alguno. sntomas nuevos o que empeoran.  Fue un placer cuidar de usted hoy.  Telly Jawad S. Mohamedamin Nifong, MD  

## 2022-05-08 NOTE — ED Provider Notes (Signed)
[facility] Provider Note    Event Date/Time   First MD Initiated Contact with Patient 05/08/22 0940     (approximate)   History   Foreign Body   HPI  Maximilien A Destin Vinsant is a 47 y.o. male   Past medical history of no signifant past medical history presents the emergency department with rectal pain.  Has been constipated and pushing hard hard stools. Pain for past several days  No other GU sx or testicular pain.  No fever.  No penile discharge.   No other acute medical complaints.   History was obtained via patient.  Reviewed external medical notes including ED and Urology visits in November 2021 for prostatitis treated w abx.       Physical Exam   Triage Vital Signs: ED Triage Vitals  Enc Vitals Group     BP 05/08/22 0812 (!) 138/91     Pulse Rate 05/08/22 0812 81     Resp 05/08/22 0812 16     Temp 05/08/22 0812 98.3 F (36.8 C)     Temp src --      SpO2 05/08/22 0812 98 %     Weight 05/08/22 0819 215 lb (97.5 kg)     Height 05/08/22 0819 6' (1.829 m)     Head Circumference --      Peak Flow --      Pain Score 05/08/22 0819 7     Pain Loc --      Pain Edu? --      Excl. in [place]? --     Most recent vital signs: Vitals:   05/08/22 0812  BP: (!) 138/91  Pulse: 81  Resp: 16  Temp: 98.3 F (36.8 C)  SpO2: 98%    General: Awake, no distress.  CV:  Good peripheral perfusion.  Resp:  Normal effort.  Abd:  No distention.  Other:  Tenderness to lateral external anus w questional small fissure no hemorrhoids, no internal pain, firm non boggy non tender prostate, no blood or melena.    ED Results / Procedures / Treatments   Labs (all labs ordered are listed, but only abnormal results are displayed) Labs Reviewed - No data to display   PROCEDURES:  Critical Care performed: No  Procedures   MEDICATIONS ORDERED IN ED: Medications - No data to display  IMPRESSION / MDM / [place] / ED COURSE  I reviewed  the triage vital signs and the nursing notes.                              Differential diagnosis includes, but is not limited to, rectal fissure, hemorrhoid, GI bleeding, FB, prostatitis, STI, GU infx, UTI    MDM:  Most likely fissure iso constipation, doubt prostatitis or other GU infection; treat w fiber and lidocaine and sitz and have him f/u with PMD. Return for refractory pain or progression of symptoms.    Patient's presentation is most consistent with acute presentation with potential threat to life or bodily function.       FINAL CLINICAL IMPRESSION(S) / ED DIAGNOSES   Final diagnoses:  Rectal pain  Rectal fissure     Rx / DC Orders   ED Discharge Orders          Ordered    polyethylene glycol (MIRALAX) 17 g packet  Daily        05/08/22 0958    psyllium (  METAMUCIL SMOOTH TEXTURE) 58.6 % powder  Daily        05/08/22 0958    Misc. Devices (SITZ BATH) MISC  2 times daily        05/08/22 0958    lidocaine (XYLOCAINE) 5 % ointment  As needed        05/08/22 7048             Note:  This document was prepared using Dragon voice recognition software and may include unintentional dictation errors.    Pilar Jarvis, MD 05/08/22 1003

## 2022-05-08 NOTE — ED Triage Notes (Signed)
Interpreter used Pt to ED POV for feeling like fish bone stuck in anus after eating fish yesterday.  Reports BM x2 since yesterday NAD noted No orders at this time per Dr Archie Balboa

## 2023-08-20 ENCOUNTER — Other Ambulatory Visit: Payer: Self-pay

## 2023-08-20 ENCOUNTER — Emergency Department
Admission: EM | Admit: 2023-08-20 | Discharge: 2023-08-20 | Disposition: A | Discharge status: Home / Self Care | Payer: Self-pay | Attending: Emergency Medicine | Admitting: Emergency Medicine

## 2023-08-20 ENCOUNTER — Emergency Department: Payer: Self-pay

## 2023-08-20 ENCOUNTER — Encounter: Payer: Self-pay | Admitting: Emergency Medicine

## 2023-08-20 DIAGNOSIS — M109 Gout, unspecified: Secondary | ICD-10-CM | POA: Insufficient documentation

## 2023-08-20 LAB — COMPREHENSIVE METABOLIC PANEL WITH GFR
ALT: 24 U/L (ref 0–44)
AST: 19 U/L (ref 15–41)
Albumin: 4 g/dL (ref 3.5–5.0)
Alkaline Phosphatase: 62 U/L (ref 38–126)
Anion gap: 8 (ref 5–15)
BUN: 15 mg/dL (ref 6–20)
CO2: 27 mmol/L (ref 22–32)
Calcium: 8.8 mg/dL — ABNORMAL LOW (ref 8.9–10.3)
Chloride: 102 mmol/L (ref 98–111)
Creatinine, Ser: 0.98 mg/dL (ref 0.61–1.24)
GFR, Estimated: >60 mL/min (ref >60)
Glucose, Bld: 131 mg/dL — ABNORMAL HIGH (ref 70–99)
Potassium: 3.8 mmol/L (ref 3.5–5.1)
Sodium: 137 mmol/L (ref 135–145)
Total Bilirubin: 1 mg/dL (ref 0.0–1.2)
Total Protein: 7.5 g/dL (ref 6.5–8.1)

## 2023-08-20 LAB — CBC WITH DIFFERENTIAL/PLATELET
Abs Immature Granulocytes: 0.02 10*3/uL (ref 0.00–0.07)
Basophils Absolute: 0 10*3/uL (ref 0.0–0.1)
Basophils Relative: 0 %
Eosinophils Absolute: 0.1 10*3/uL (ref 0.0–0.5)
Eosinophils Relative: 1 %
HCT: 47.1 % (ref 39.0–52.0)
Hemoglobin: 16.3 g/dL (ref 13.0–17.0)
Immature Granulocytes: 0 %
Lymphocytes Relative: 23 %
Lymphs Abs: 1.9 10*3/uL (ref 0.7–4.0)
MCH: 32.3 pg (ref 26.0–34.0)
MCHC: 34.6 g/dL (ref 30.0–36.0)
MCV: 93.5 fL (ref 80.0–100.0)
Monocytes Absolute: 0.5 10*3/uL (ref 0.1–1.0)
Monocytes Relative: 6 %
Neutro Abs: 5.8 10*3/uL (ref 1.7–7.7)
Neutrophils Relative %: 70 %
Platelets: 155 10*3/uL (ref 150–400)
RBC: 5.04 MIL/uL (ref 4.22–5.81)
RDW: 13.2 % (ref 11.5–15.5)
WBC: 8.3 10*3/uL (ref 4.0–10.5)
nRBC: 0 % (ref 0.0–0.2)

## 2023-08-20 LAB — URIC ACID: Uric Acid, Serum: 7.5 mg/dL (ref 3.7–8.6)

## 2023-08-20 MED ORDER — COLCHICINE 0.6 MG PO TABS: 1.2000 mg | ORAL_TABLET | Freq: Every day | ORAL | 1 refills | Status: AC | PRN

## 2023-08-20 MED ORDER — OXYCODONE-ACETAMINOPHEN 7.5-325 MG PO TABS: 1.0000 | ORAL_TABLET | ORAL | 0 refills | Status: DC | PRN

## 2023-08-20 MED ORDER — OXYCODONE-ACETAMINOPHEN 5-325 MG PO TABS: 1.0000 | ORAL_TABLET | Freq: Three times a day (TID) | ORAL | 0 refills | Status: AC | PRN

## 2023-08-20 NOTE — ED Provider Notes (Signed)
 [facility] Provider Note    Event Date/Time   First MD Initiated Contact with Patient 08/20/23 1518     (approximate)   History   Foot Pain   HPI  Darrell Meyer is a 48 y.o. male with no significant past medical history and as listed in EMR presents to the emergency department for treatment and evaluation of pain in the joint of his right great toe.  No specific injury.  Symptoms started 2 days ago.  He noticed the intense pain while he was in the shower.  Pain increases with flexion of the toe.      Physical Exam   Triage Vital Signs: ED Triage Vitals  Encounter Vitals Group     BP 08/20/23 1324 (!) 149/93     Systolic BP Percentile --      Diastolic BP Percentile --      Pulse Rate 08/20/23 1324 69     Resp 08/20/23 1324 18     Temp 08/20/23 1324 98 F (36.7 C)     Temp Source 08/20/23 1324 Oral     SpO2 08/20/23 1324 100 %     Weight 08/20/23 1337 216 lb (98 kg)     Height 08/20/23 1337 6\' 2"  (1.88 m)     Head Circumference --      Peak Flow --      Pain Score 08/20/23 1336 9     Pain Loc --      Pain Education --      Exclude from Growth Chart --     Most recent vital signs: Vitals:   08/20/23 1324  BP: (!) 149/93  Pulse: 69  Resp: 18  Temp: 98 F (36.7 C)  SpO2: 100%    General: Awake, no distress.  CV:  Good peripheral perfusion.  Resp:  Normal effort.  Abd:  No distention.  Other:  Edema and erythema of MCP of right great toe.   ED Results / Procedures / Treatments   Labs (all labs ordered are listed, but only abnormal results are displayed) Labs Reviewed  COMPREHENSIVE METABOLIC PANEL WITH GFR - Abnormal; Notable for the following components:      Result Value   Glucose, Bld 131 (*)    Calcium 8.8 (*)    All other components within normal limits  CBC WITH DIFFERENTIAL/PLATELET  URIC ACID     EKG  Not indicated   RADIOLOGY  Image and radiology report reviewed and interpreted by me.  Radiology report pending at time of discharge.  On my read, no acute bony abnormality of the right foot.  PROCEDURES:  Critical Care performed: No  Procedures   MEDICATIONS ORDERED IN ED:  Medications - No data to display   IMPRESSION / MDM / ASSESSMENT AND PLAN / ED COURSE   I have reviewed the triage note.  Differential diagnosis includes, but is not limited to, gout, cellulitis  Patient's presentation is most consistent with acute illness / injury with system symptoms.  48 year old male presenting to the emergency department for treatment and evaluation of pain, redness, and swelling of the right great toe that has been present for the past 2 days.  See HPI for further details.  While awaiting ER room assignment, labs were submitted.  CBC is reassuring.  CMP also reassuring.  Glucose of 131 is nonfasting.  Uric acid added to labs already drawn.  Uric acid level is normal.  Based on exam, symptoms are most consistent  with gout and will be treated with colchicine  and Percocet.  Work excuse provided for the next couple of days as well.  He was instructed to follow-up with primary care or return to the emergency department for symptoms that are not improving, change, or worsen.      FINAL CLINICAL IMPRESSION(S) / ED DIAGNOSES   Final diagnoses:  Acute gout involving toe of right foot, unspecified cause     Rx / DC Orders   ED Discharge Orders          Ordered    colchicine  0.6 MG tablet  Daily PRN        08/20/23 1712    oxyCODONE -acetaminophen  (PERCOCET) 7.5-325 MG tablet  Every 4 hours PRN        08/20/23 1712             Note:  This document was prepared using Dragon voice recognition software and may include unintentional dictation errors.   Sherryle Don, FNP 08/20/23 1715    Twilla Galea, MD 08/20/23 2246

## 2023-08-20 NOTE — ED Notes (Signed)
 This RN with the assistance of an interpreter on a stick. Pt states that 2 days ago, pt was in the bathtub and was stretching and felt a big crack in my big toe on my right foot. Pt also states that they were not able to sleep last night because of the pain. Pt states that they are not able to put any weight on the foot. Pt states that the swelling showed up 24 hours after the initial "pop," they were able to put weight on the foot until later that night and they weren't able to put any weight on the foot. Pt states that the pain is a 9/10 and that sometimes the pain travels up in the inside of their leg over their ankle. The large joint on the big toe is swollen, red and warm to the touch during assessment.

## 2023-08-20 NOTE — Discharge Instructions (Addendum)
 Follow-up with primary care or return to the emergency department for symptoms change or worsen.

## 2023-08-20 NOTE — ED Triage Notes (Signed)
 Pt in via POV, reports right foot pain at the base of great toe, redness, warmth noted to that area, appears as a possible cellulitis.  Ambulatory to triage, NAD noted at this time.
